# Patient Record
Sex: Male | Born: 2005 | Race: Black or African American | Hispanic: No | Marital: Single
Health system: Southern US, Community
[De-identification: ages and names within clinical notes are randomized; demographics above are authoritative.]

## PROBLEM LIST (undated history)

## (undated) DIAGNOSIS — F909 Attention-deficit hyperactivity disorder, unspecified type: Secondary | ICD-10-CM

## (undated) DIAGNOSIS — E669 Obesity, unspecified: Secondary | ICD-10-CM

## (undated) HISTORY — DX: Attention-deficit hyperactivity disorder, unspecified type: F90.9

## (undated) HISTORY — DX: Obesity, unspecified: E66.9

---
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
## 2023-02-23 ENCOUNTER — Other Ambulatory Visit: Payer: Self-pay

## 2023-02-23 ENCOUNTER — Emergency Department (HOSPITAL_COMMUNITY): Payer: Medicaid Other

## 2023-02-23 ENCOUNTER — Emergency Department (HOSPITAL_COMMUNITY)
Admission: EM | Admit: 2023-02-23 | Discharge: 2023-02-23 | Disposition: A | Discharge status: Home / Self Care | Payer: Medicaid Other | Attending: Emergency Medicine | Admitting: Emergency Medicine

## 2023-02-23 DIAGNOSIS — Y92410 Unspecified street and highway as the place of occurrence of the external cause: Secondary | ICD-10-CM | POA: Diagnosis not present

## 2023-02-23 DIAGNOSIS — S060X0A Concussion without loss of consciousness, initial encounter: Secondary | ICD-10-CM | POA: Insufficient documentation

## 2023-02-23 DIAGNOSIS — Z23 Encounter for immunization: Secondary | ICD-10-CM | POA: Diagnosis not present

## 2023-02-23 DIAGNOSIS — S161XXA Strain of muscle, fascia and tendon at neck level, initial encounter: Secondary | ICD-10-CM | POA: Insufficient documentation

## 2023-02-23 DIAGNOSIS — S0990XA Unspecified injury of head, initial encounter: Secondary | ICD-10-CM | POA: Diagnosis present

## 2023-02-23 DIAGNOSIS — S0081XA Abrasion of other part of head, initial encounter: Secondary | ICD-10-CM | POA: Insufficient documentation

## 2023-02-23 MED ORDER — FENTANYL CITRATE PF 50 MCG/ML IJ SOSY
50.0000 ug | PREFILLED_SYRINGE | Freq: Once | INTRAMUSCULAR | Status: AC
  Administered 2023-02-23: 50 ug via INTRAVENOUS
  Filled 2023-02-23: qty 1

## 2023-02-23 MED ORDER — KETOROLAC TROMETHAMINE 15 MG/ML IJ SOLN
15.0000 mg | Freq: Once | INTRAMUSCULAR | Status: AC
  Administered 2023-02-23: 15 mg via INTRAVENOUS
  Filled 2023-02-23: qty 1

## 2023-02-23 MED ORDER — TETANUS-DIPHTH-ACELL PERTUSSIS 5-2.5-18.5 LF-MCG/0.5 IM SUSY
0.5000 mL | PREFILLED_SYRINGE | Freq: Once | INTRAMUSCULAR | Status: AC
  Administered 2023-02-23: 0.5 mL via INTRAMUSCULAR
  Filled 2023-02-23: qty 0.5
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
## 2023-02-23 NOTE — ED Notes (Signed)
Bacitracin was placed on pt's wounds on face
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
## 2023-02-23 NOTE — ED Notes (Signed)
I cleaned pt's face with NS

## 2024-03-08 ENCOUNTER — Institutional Professional Consult (permissible substitution): Admitting: Plastic Surgery

## 2024-03-09 ENCOUNTER — Ambulatory Visit: Admitting: Plastic Surgery

## 2024-03-09 ENCOUNTER — Encounter: Payer: Self-pay | Admitting: Plastic Surgery

## 2024-03-09 VITALS — BP 144/80 | HR 58 | Ht 69.0 in | Wt 334.6 lb

## 2024-03-09 DIAGNOSIS — L905 Scar conditions and fibrosis of skin: Secondary | ICD-10-CM

## 2024-03-09 DIAGNOSIS — H02411 Mechanical ptosis of right eyelid: Secondary | ICD-10-CM | POA: Diagnosis not present
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
## 2024-03-30 ENCOUNTER — Encounter: Payer: Self-pay | Admitting: Plastic Surgery

## 2024-03-30 ENCOUNTER — Ambulatory Visit: Admitting: Plastic Surgery

## 2024-03-30 VITALS — BP 133/80 | HR 72

## 2024-03-30 DIAGNOSIS — L905 Scar conditions and fibrosis of skin: Secondary | ICD-10-CM

## 2024-03-30 NOTE — Progress Notes (Signed)
 Procedure Note  Preoperative Dx: Right upper eyelid scar  Postoperative Dx: Same  Procedure: Of right upper eyelid scar  Anesthesia: Lidocaine 1% with 1:100,000 epinephrine and 0.25% Sensorcaine   Indication for Procedure: Improve the appearance of a traumatic scar  Description of Procedure: Risks and complications were explained to the patient including less than expected elevation of the eyelid..  Consent was confirmed and the patient understands the risks and benefits.  The potential complications and alternatives were explained and the patient consents.  The patient expressed understanding the option of not having the procedure and the risks of a scar.  Time out was called and all information was confirmed to be correct.    The area was prepped and drapped.  Local anesthetic was injected in the subcutaneous tissues.  After waiting for the local to take affect an elliptical incision was made around the 1 cm scar.  After obtaining hemostasis, the surgical wound was closed with interrupted 5-0 Prolene sutures.  The surgical wound measured 1.2 cm.  A dressing was applied.  The patient was given instructions on how to care for the area and a follow up appointment.  Eduard tolerated the procedure well and there were no complications.

## 2024-04-06 ENCOUNTER — Ambulatory Visit (INDEPENDENT_AMBULATORY_CARE_PROVIDER_SITE_OTHER): Payer: Self-pay | Admitting: Surgical

## 2024-04-06 VITALS — BP 123/67 | HR 56

## 2024-04-06 DIAGNOSIS — L905 Scar conditions and fibrosis of skin: Secondary | ICD-10-CM

## 2024-04-06 NOTE — Progress Notes (Signed)
 Patient here for follow-up after excision of right eyelid scar with Dr. Carolynne Citron on 03/30/2024. He is doing well.  He does not have any issues.  He does report that 2 of the Prolene sutures fell out prior to appointment today.  There is 1 Prolene still present.  On exam right eyelid incision is intact, there is some scabbing noted.  1 Prolene suture is noted.  No erythema.  Prolene suture was removed, he tolerated this well.  Discussed importance of sunscreen to prevent hyperpigmentation. Recommend following up as needed.  Call with questions or concerns.  Pictures were obtained of the patient and placed in the chart with the patient's or guardian's permission.
